# Patient Record
Sex: Male | Born: 1975 | Race: White | Hispanic: No | Marital: Married | Smoking: Current every day smoker
Health system: Southern US, Community
[De-identification: ages and names within clinical notes are randomized; demographics above are authoritative.]

---

## 2008-12-29 ENCOUNTER — Emergency Department: Payer: Self-pay | Admitting: Unknown Physician Specialty

## 2015-06-26 ENCOUNTER — Encounter: Payer: Self-pay | Admitting: Emergency Medicine

## 2015-06-26 ENCOUNTER — Emergency Department
Admission: EM | Admit: 2015-06-26 | Discharge: 2015-06-26 | Disposition: A | Discharge status: Home / Self Care | Payer: 59 | Attending: Emergency Medicine | Admitting: Emergency Medicine

## 2015-06-26 DIAGNOSIS — L02415 Cutaneous abscess of right lower limb: Secondary | ICD-10-CM | POA: Diagnosis not present

## 2015-06-26 DIAGNOSIS — M25561 Pain in right knee: Secondary | ICD-10-CM | POA: Diagnosis present

## 2015-06-26 DIAGNOSIS — Z720 Tobacco use: Secondary | ICD-10-CM | POA: Diagnosis not present

## 2015-06-26 MED ORDER — SULFAMETHOXAZOLE-TRIMETHOPRIM 800-160 MG PO TABS: 1.0000 | ORAL_TABLET | Freq: Two times a day (BID) | ORAL | Status: DC

## 2015-06-26 MED ORDER — IBUPROFEN 800 MG PO TABS: 800.0000 mg | ORAL_TABLET | Freq: Three times a day (TID) | ORAL | Status: DC | PRN

## 2015-06-26 MED ORDER — HYDROCODONE-ACETAMINOPHEN 5-325 MG PO TABS: 1.0000 | ORAL_TABLET | ORAL | Status: DC | PRN

## 2015-06-26 NOTE — ED Notes (Signed)
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 Unsure if he injured it but does state he is kneeling most of the time for work and is constantly bumping his knees.

## 2015-06-26 NOTE — ED Notes (Signed)
bandaid placed over knee.  Instructions given to stop Amoxicillin that he is taking at home. Also discussed wound cultures if positive he would be notified.

## 2015-06-26 NOTE — Discharge Instructions (Signed)
Abscess An abscess is an infected area that contains a collection of pus and debris.It can occur in almost any part of the body. An abscess is also known as a furuncle or boil. CAUSES  An abscess occurs when tissue gets infected. This can occur from blockage of oil or sweat glands, infection of hair follicles, or a minor injury to the skin. As the body tries to fight the infection, pus collects in the area and creates pressure under the skin. This pressure causes pain. People with weakened immune systems have difficulty fighting infections and get certain abscesses more often.  SYMPTOMS Usually an abscess develops on the skin and becomes a painful mass that is red, warm, and tender. If the abscess forms under the skin, you may feel a moveable soft area under the skin. Some abscesses break open (rupture) on their own, but most will continue to get worse without care. The infection can spread deeper into the body and eventually into the bloodstream, causing you to feel ill.  DIAGNOSIS  Your caregiver will take your medical history and perform a physical exam. A sample of fluid may also be taken from the abscess to determine what is causing your infection. TREATMENT  Your caregiver may prescribe antibiotic medicines to fight the infection. However, taking antibiotics alone usually does not cure an abscess. Your caregiver may need to make a small cut (incision) in the abscess to drain the pus. In some cases, gauze is packed into the abscess to reduce pain and to continue draining the area. HOME CARE INSTRUCTIONS   Only take over-the-counter or prescription medicines for pain, discomfort, or fever as directed by your caregiver.  If you were prescribed antibiotics, take them as directed. Finish them even if you start to feel better.  If gauze is used, follow your caregiver's directions for changing the gauze.  To avoid spreading the infection:  Keep your draining abscess covered with a  bandage.  Wash your hands well.  Do not share personal care items, towels, or whirlpools with others.  Avoid skin contact with others.  Keep your skin and clothes clean around the abscess.  Keep all follow-up appointments as directed by your caregiver. SEEK MEDICAL CARE IF:   You have increased pain, swelling, redness, fluid drainage, or bleeding.  You have muscle aches, chills, or a general ill feeling.  You have a fever. MAKE SURE YOU:   Understand these instructions.  Will watch your condition.  Will get help right away if you are not doing well or get worse.   This information is not intended to replace advice given to you by your health care provider. Make sure you discuss any questions you have with your health care provider.   Document Released: 06/15/2005 Document Revised: 03/06/2012 Document Reviewed: 11/18/2011 Elsevier Interactive Patient Education 2016 [employer] Abscess An abscess is an infected area that contains a collection of pus and debris.It can occur in almost any part of the body. An abscess is also known as a furuncle or boil. CAUSES  An abscess occurs when tissue gets infected. This can occur from blockage of oil or sweat glands, infection of hair follicles, or a minor injury to the skin. As the body tries to fight the infection, pus collects in the area and creates pressure under the skin. This pressure causes pain. People with weakened immune systems have difficulty fighting infections and get certain abscesses more often.  SYMPTOMS Usually an abscess develops on the skin and becomes a painful   mass that is red, warm, and tender. If the abscess forms under the skin, you may feel a moveable soft area under the skin. Some abscesses break open (rupture) on their own, but most will continue to get worse without care. The infection can spread deeper into the body and eventually into the bloodstream, causing you to feel ill.  DIAGNOSIS  Your caregiver will  take your medical history and perform a physical exam. A sample of fluid may also be taken from the abscess to determine what is causing your infection. TREATMENT  Your caregiver may prescribe antibiotic medicines to fight the infection. However, taking antibiotics alone usually does not cure an abscess. Your caregiver may need to make a small cut (incision) in the abscess to drain the pus. In some cases, gauze is packed into the abscess to reduce pain and to continue draining the area. HOME CARE INSTRUCTIONS   Only take over-the-counter or prescription medicines for pain, discomfort, or fever as directed by your caregiver.  If you were prescribed antibiotics, take them as directed. Finish them even if you start to feel better.  If gauze is used, follow your caregiver's directions for changing the gauze.  To avoid spreading the infection:  Keep your draining abscess covered with a bandage.  Wash your hands well.  Do not share personal care items, towels, or whirlpools with others.  Avoid skin contact with others.  Keep your skin and clothes clean around the abscess.  Keep all follow-up appointments as directed by your caregiver. SEEK MEDICAL CARE IF:   You have increased pain, swelling, redness, fluid drainage, or bleeding.  You have muscle aches, chills, or a general ill feeling.  You have a fever. MAKE SURE YOU:   Understand these instructions.  Will watch your condition.  Will get help right away if you are not doing well or get worse.   This information is not intended to replace advice given to you by your health care provider. Make sure you discuss any questions you have with your health care provider.   Document Released: 06/15/2005 Document Revised: 03/06/2012 Document Reviewed: 11/18/2011 Elsevier Interactive Patient Education 2016 [employer]  

## 2015-06-26 NOTE — ED Notes (Signed)
Pt reports right knee pain since last week.

## 2015-06-26 NOTE — ED Provider Notes (Signed)
[facility] Emergency Department Provider Note  ____________________________________________  Time seen: Approximately 8:38 AM  I have reviewed the triage vital signs and the nursing notes.   HISTORY  Chief Complaint Knee Pain    HPI Lan Mcneill is a 39 y.o. male who presents with pus and a bump on his right knee. 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 Planes of continued pain and drainage.   History reviewed. Gould pertinent past medical history.  There are Gould active problems to display for this patient.   History reviewed. Gould pertinent past surgical history.  Current Outpatient Rx  Name  Route  Sig  Dispense  Refill  . HYDROcodone-acetaminophen (NORCO) 5-325 MG tablet   Oral   Take 1-2 tablets by mouth every 4 (four) hours as needed for moderate pain.   15 tablet   0   . ibuprofen (ADVIL,MOTRIN) 800 MG tablet   Oral   Take 1 tablet (800 mg total) by mouth every 8 (eight) hours as needed.   30 tablet   0   . sulfamethoxazole-trimethoprim (BACTRIM DS,SEPTRA DS) 800-160 MG tablet   Oral   Take 1 tablet by mouth 2 (two) times daily.   20 tablet   0     Allergies Review of patient's allergies indicates Gould known allergies.  Gould family history on file.  Social History Social History  Substance Use Topics  . Smoking status: Current Some Day Smoker  . Smokeless tobacco: None  . Alcohol Use: Gould    Review of Systems Constitutional: Gould fever/chills Eyes: Gould visual changes. ENT: Gould sore throat. Cardiovascular: Denies chest pain. Respiratory: Denies shortness of breath. Gastrointestinal: Gould abdominal pain.  Gould nausea, Gould vomiting.  Gould diarrhea.  Gould constipation. Genitourinary: Negative for dysuria. Musculoskeletal: Negative for back pain. Skin: Positive for 1.5 cm pustular lesion on the right knee. Neurological: Negative for headaches, focal weakness or numbness.  10-point ROS otherwise  negative.  ____________________________________________   PHYSICAL EXAM:  VITAL SIGNS: ED Triage Vitals  Enc Vitals Group     BP --      Pulse --      Resp --      Temp --      Temp src --      SpO2 --      Weight --      Height --      Head Cir --      Peak Flow --      Pain Score 06/26/15 0817 4     Pain Loc --      Pain Edu? --      Excl. in GC? --     Constitutional: Alert and oriented. Well appearing and in Gould acute distress. Cardiovascular: Normal rate, regular rhythm. Grossly normal heart sounds.  Good peripheral circulation. Respiratory: Normal respiratory effort.  Gould retractions. Lungs CTAB. Musculoskeletal: Gould lower extremity tenderness nor edema.  Gould joint effusions. Neurologic:  Normal speech and language. Gould gross focal neurologic deficits are appreciated. Gould gait instability. Skin:  Skin is warm, dry and intact. Gould rash noted. 1.5 cm pustular lesion noted. Wound cultures obtained. Psychiatric: Mood and affect are normal. Speech and behavior are normal.  ____________________________________________   LABS (all labs ordered are listed, but only abnormal results are displayed)  Labs Reviewed  WOUND CULTURE     PROCEDURES  Procedure(s) performed: None  Critical Care performed: Gould  ____________________________________________   INITIAL IMPRESSION / [facility]  [facility] / ED COURSE  Pertinent labs & imaging results that were available during my care of the patient were reviewed by me and considered in my medical decision making (see chart for details). Rx given started on Bactrim DS twice a day #20, ibuprofen 800 mg 3 times a day and hydrocodone 5/325 #8 as needed for pain. Patient follow-up with PCP or return to the ER with any worsening symptomology. ____________________________________________   FINAL CLINICAL IMPRESSION(S) / ED DIAGNOSES  Final diagnoses:  Abscess of knee, right      Evangeline Dakin, PA-C 06/26/15 1022  Sharyn Creamer,  MD 06/26/15 [phone]

## 2015-06-29 LAB — WOUND CULTURE: Special Requests: NORMAL

## 2019-06-23 ENCOUNTER — Emergency Department: Payer: 59

## 2019-06-23 ENCOUNTER — Encounter: Payer: Self-pay | Admitting: Emergency Medicine

## 2019-06-23 ENCOUNTER — Emergency Department
Admission: EM | Admit: 2019-06-23 | Discharge: 2019-06-23 | Disposition: A | Discharge status: Home / Self Care | Payer: 59 | Attending: Emergency Medicine | Admitting: Emergency Medicine

## 2019-06-23 ENCOUNTER — Other Ambulatory Visit: Payer: Self-pay

## 2019-06-23 DIAGNOSIS — F17200 Nicotine dependence, unspecified, uncomplicated: Secondary | ICD-10-CM | POA: Diagnosis not present

## 2019-06-23 DIAGNOSIS — N200 Calculus of kidney: Secondary | ICD-10-CM | POA: Insufficient documentation

## 2019-06-23 DIAGNOSIS — R1030 Lower abdominal pain, unspecified: Secondary | ICD-10-CM | POA: Diagnosis present

## 2019-06-23 DIAGNOSIS — R110 Nausea: Secondary | ICD-10-CM | POA: Insufficient documentation

## 2019-06-23 DIAGNOSIS — F1210 Cannabis abuse, uncomplicated: Secondary | ICD-10-CM | POA: Diagnosis not present

## 2019-06-23 LAB — CBC WITH DIFFERENTIAL/PLATELET
Abs Immature Granulocytes: 0.03 10*3/uL (ref 0.00–0.07)
Basophils Absolute: 0 10*3/uL (ref 0.0–0.1)
Basophils Relative: 0 %
Eosinophils Absolute: 0.1 10*3/uL (ref 0.0–0.5)
Eosinophils Relative: 1 %
HCT: 43 % (ref 39.0–52.0)
Hemoglobin: 14.9 g/dL (ref 13.0–17.0)
Immature Granulocytes: 0 %
Lymphocytes Relative: 48 %
Lymphs Abs: 5 10*3/uL — ABNORMAL HIGH (ref 0.7–4.0)
MCH: 32.9 pg (ref 26.0–34.0)
MCHC: 34.7 g/dL (ref 30.0–36.0)
MCV: 94.9 fL (ref 80.0–100.0)
Monocytes Absolute: 0.6 10*3/uL (ref 0.1–1.0)
Monocytes Relative: 6 %
Neutro Abs: 4.8 10*3/uL (ref 1.7–7.7)
Neutrophils Relative %: 45 %
Platelets: 205 10*3/uL (ref 150–400)
RBC: 4.53 MIL/uL (ref 4.22–5.81)
RDW: 11.9 % (ref 11.5–15.5)
WBC: 10.5 10*3/uL (ref 4.0–10.5)
nRBC: 0 % (ref 0.0–0.2)

## 2019-06-23 LAB — URINALYSIS, COMPLETE (UACMP) WITH MICROSCOPIC
Bacteria, UA: NONE SEEN
Bilirubin Urine: NEGATIVE
Glucose, UA: NEGATIVE mg/dL
Ketones, ur: NEGATIVE mg/dL
Leukocytes,Ua: NEGATIVE
Nitrite: NEGATIVE
Protein, ur: NEGATIVE mg/dL
Specific Gravity, Urine: 1.024 (ref 1.005–1.030)
pH: 5 (ref 5.0–8.0)

## 2019-06-23 LAB — COMPREHENSIVE METABOLIC PANEL
ALT: 16 U/L (ref 0–44)
AST: 25 U/L (ref 15–41)
Albumin: 4.5 g/dL (ref 3.5–5.0)
Alkaline Phosphatase: 66 U/L (ref 38–126)
Anion gap: 11 (ref 5–15)
BUN: 16 mg/dL (ref 6–20)
CO2: 24 mmol/L (ref 22–32)
Calcium: 9.2 mg/dL (ref 8.9–10.3)
Chloride: 106 mmol/L (ref 98–111)
Creatinine, Ser: 0.92 mg/dL (ref 0.61–1.24)
GFR calc Af Amer: >60 mL/min (ref >60)
GFR calc non Af Amer: >60 mL/min (ref >60)
Glucose, Bld: 125 mg/dL — ABNORMAL HIGH (ref 70–99)
Potassium: 3.4 mmol/L — ABNORMAL LOW (ref 3.5–5.1)
Sodium: 141 mmol/L (ref 135–145)
Total Bilirubin: 0.6 mg/dL (ref 0.3–1.2)
Total Protein: 7.7 g/dL (ref 6.5–8.1)

## 2019-06-23 LAB — LIPASE, BLOOD: Lipase: 24 U/L (ref 11–51)

## 2019-06-23 MED ORDER — ONDANSETRON HCL 4 MG/2ML IJ SOLN
INTRAMUSCULAR | Status: AC
  Administered 2019-06-23: 4 mg via INTRAVENOUS
  Filled 2019-06-23: qty 2

## 2019-06-23 MED ORDER — MORPHINE SULFATE (PF) 4 MG/ML IV SOLN
INTRAVENOUS | Status: AC
  Administered 2019-06-23: 4 mg via INTRAVENOUS
  Filled 2019-06-23: qty 1

## 2019-06-23 MED ORDER — MORPHINE SULFATE (PF) 4 MG/ML IV SOLN
4.0000 mg | Freq: Once | INTRAVENOUS | Status: AC
  Administered 2019-06-23: 04:00:00 4 mg via INTRAVENOUS

## 2019-06-23 MED ORDER — ONDANSETRON HCL 4 MG/2ML IJ SOLN
4.0000 mg | Freq: Once | INTRAMUSCULAR | Status: AC
  Administered 2019-06-23: 04:00:00 4 mg via INTRAVENOUS

## 2019-06-23 MED ORDER — SODIUM CHLORIDE 0.9 % IV BOLUS
1000.0000 mL | Freq: Once | INTRAVENOUS | Status: AC
  Administered 2019-06-23: 1000 mL via INTRAVENOUS

## 2019-06-23 NOTE — ED Notes (Signed)
Peripheral IV discontinued. Catheter intact. No signs of infiltration or redness. Gauze applied to IV site.   Discharge instructions reviewed with patient. Questions fielded by this RN. Patient verbalizes understanding of instructions. Patient discharged home in stable condition per brown. No acute distress noted at time of discharge.    

## 2019-06-23 NOTE — ED Triage Notes (Signed)
Patient c/o mid/lower abdominal pain radiation to left flank and groin. Patient reports he feels like he has to have a bowel movement, but can't.

## 2019-06-23 NOTE — ED Provider Notes (Signed)
[facility] Emergency Department Provider Note    First MD Initiated Contact with Patient 06/23/19 [phone]     (approximate)  I have reviewed the triage vital signs and the nursing notes.   HISTORY  Chief Complaint Abdominal Pain   HPI Kyle Gould is a 43 y.o. male presents to the emergency department secondary to acute onset of left flank/left groin pain which patient states began abruptly this morning.  Patient also admits to nausea.  Patient denies any diarrhea constipation no fever.  Patient denies any history of kidney stones or diverticulitis.  Patient denies any urinary symptoms.       History reviewed. No pertinent past medical history.  There are no active problems to display for this patient.   History reviewed. No pertinent surgical history.  Prior to Admission medications   Not on File    Allergies Patient has no known allergies.  History reviewed. No pertinent family history.  Social History Social History   Tobacco Use   Smoking status: Current Every Day Smoker   Smokeless tobacco: Never Used  Substance Use Topics   Alcohol use: No   Drug use: Yes    Types: Marijuana    Comment: marijuana earlier saturday    Review of Systems Constitutional: No fever/chills Eyes: No visual changes. ENT: No sore throat. Cardiovascular: Denies chest pain. Respiratory: Denies shortness of breath. Gastrointestinal: Positive for left flank/groin pain.  No nausea, no vomiting.  No diarrhea.  No constipation. Genitourinary: Negative for dysuria. Musculoskeletal: Negative for neck pain.  Negative for back pain. Integumentary: Negative for rash. Neurological: Negative for headaches, focal weakness or numbness.  ____________________________________________   PHYSICAL EXAM:  VITAL SIGNS: ED Triage Vitals  Enc Vitals Group     BP 06/23/19 0325 (!) 135/99     Pulse Rate 06/23/19 0325 (!) 53     Resp 06/23/19 0325 17     Temp  06/23/19 0325 (!) 97.3 F (36.3 C)     Temp Source 06/23/19 0325 Oral     SpO2 06/23/19 0325 100 %     Weight 06/23/19 0326 59 kg (130 lb)     Height 06/23/19 0326 1.803 m (5\' 11" )     Head Circumference --      Peak Flow --      Pain Score 06/23/19 0326 8     Pain Loc --      Pain Edu? --      Excl. in GC? --     Constitutional: Alert and oriented.  Apparent discomfort Eyes: Conjunctivae are normal.  Head: Atraumatic. Mouth/Throat: Mucous membranes are moist. Neck: No stridor.  No meningeal signs.   Cardiovascular: Normal rate, regular rhythm. Good peripheral circulation. Grossly normal heart sounds. Respiratory: Normal respiratory effort.  No retractions. Gastrointestinal: Soft and nontender. No distention.  Musculoskeletal: No lower extremity tenderness nor edema. No gross deformities of extremities. Neurologic:  Normal speech and language. No gross focal neurologic deficits are appreciated.  Skin:  Skin is warm, dry and intact. Psychiatric: Mood and affect are normal. Speech and behavior are normal.  ____________________________________________   LABS (all labs ordered are listed, but only abnormal results are displayed)  Labs Reviewed  CBC WITH DIFFERENTIAL/PLATELET - Abnormal; Notable for the following components:      Result Value   Lymphs Abs 5.0 (*)    All other components within normal limits  COMPREHENSIVE METABOLIC PANEL - Abnormal; Notable for the following components:   Potassium 3.4 (*)  Glucose, Bld 125 (*)    All other components within normal limits  URINALYSIS, COMPLETE (UACMP) WITH MICROSCOPIC - Abnormal; Notable for the following components:   Color, Urine YELLOW (*)    APPearance CLEAR (*)    Hgb urine dipstick LARGE (*)    All other components within normal limits  LIPASE, BLOOD    RADIOLOGY I, [place] Shamela Haydon, personally viewed and evaluated these images (plain radiographs) as part of my medical decision making, as well as reviewing the  written report by the radiologist.  ED MD interpretation: No obstructive uropathy or other acute abdominal pelvic pathology noted per radiologist  Official radiology report(s): Ct Renal Stone Study  Result Date: 06/23/2019 CLINICAL DATA:  Left flank pain EXAM: CT ABDOMEN AND PELVIS WITHOUT CONTRAST TECHNIQUE: Multidetector CT imaging of the abdomen and pelvis was performed following the standard protocol without IV contrast. COMPARISON:  None. FINDINGS: LOWER CHEST: There is no basilar pleural or apical pericardial effusion. HEPATOBILIARY: The hepatic contours and density are normal. There is no intra- or extrahepatic biliary dilatation. The gallbladder is normal. PANCREAS: The pancreatic parenchymal contours are normal and there is no ductal dilatation. There is no peripancreatic fluid collection. SPLEEN: Normal. ADRENALS/URINARY TRACT: --Adrenal glands: Normal. --Right kidney/ureter: There is an interpolar cyst measuring 2.9 cm. No hydronephrosis, nephroureterolithiasis, perinephric stranding or solid renal mass. --Left kidney/ureter: No hydronephrosis, nephroureterolithiasis, perinephric stranding or solid renal mass. --Urinary bladder: Normal for degree of distention STOMACH/BOWEL: --Stomach/Duodenum: There is no hiatal hernia or other gastric abnormality. The duodenal course and caliber are normal. --Small bowel: No dilatation or inflammation. --Colon: No focal abnormality. --Appendix: Not visualized. No right lower quadrant inflammation or free fluid. VASCULAR/LYMPHATIC: Normal course and caliber of the major abdominal vessels. No abdominal or pelvic lymphadenopathy. REPRODUCTIVE: Normal prostate size with symmetric seminal vesicles. MUSCULOSKELETAL. No bony spinal canal stenosis or focal osseous abnormality. OTHER: None. IMPRESSION: No obstructive uropathy or other acute abdominopelvic abnormality. Electronically Signed   By: Deatra RobinsonKevin  Herman M.D.   On: 06/23/2019 04:08     ____________________________________________   Procedures   ____________________________________________   INITIAL IMPRESSION / MDM / ASSESSMENT AND PLAN / ED COURSE  As part of my medical decision making, I reviewed the following data within the electronic MEDICAL RECORD NUMBER   43 year old male presented with above-stated history and physical exam concerning for possible left ureterolithiasis and less likely reticulitis.  Patient given IV morphine 4 mg and Zofran 4 mg on reevaluation patient states that pain is much better.  CT scan of the abdomen and pelvis revealed no acute pathology however patient's urinalysis did revealed hematuria and as such I suspect that the patient may have passed a kidney stone while in the emergency department.     ____________________________________________  FINAL CLINICAL IMPRESSION(S) / ED DIAGNOSES  Final diagnoses:  Kidney stone     MEDICATIONS GIVEN DURING THIS VISIT:  Medications  morphine 4 MG/ML injection 4 mg (4 mg Intravenous Given 06/23/19 0333)  ondansetron (ZOFRAN) injection 4 mg (4 mg Intravenous Given 06/23/19 0331)  sodium chloride 0.9 % bolus 1,000 mL (0 mLs Intravenous Stopped 06/23/19 0455)     ED Discharge Orders    None      *Please note:  Kyle Gould was evaluated in Emergency Department on 06/23/2019 for the symptoms described in the history of present illness. He was evaluated in the context of the global COVID-19 pandemic, which necessitated consideration that the patient might be at risk for infection with the SARS-CoV-2 virus that  causes COVID-19. Institutional protocols and algorithms that pertain to the evaluation of patients at risk for COVID-19 are in a state of rapid change based on information released by regulatory bodies including the CDC and federal and state organizations. These policies and algorithms were followed during the patient's care in the ED.  Some ED evaluations and interventions may be delayed as  a result of limited staffing during the pandemic.*  Note:  This document was prepared using Dragon voice recognition software and may include unintentional dictation errors.   Gregor Hams, MD 06/23/19 [phone]

## 2020-12-16 IMAGING — CT CT RENAL STONE PROTOCOL
3 of 4 series · 9 of 46 positions shown, 16 images · non-contrast
Comparison: None.

CLINICAL DATA: Left flank pain

EXAM:
CT ABDOMEN AND PELVIS WITHOUT CONTRAST
TECHNIQUE: Multidetector CT imaging of the abdomen and pelvis was performed
following the standard protocol without IV contrast.

[Series 4: lung bases · axial · 0.72mm/px · z∈[-779,-699]mm · 5 of 24 slices shown, 10 images]
[im 4/24  soft-tissue]
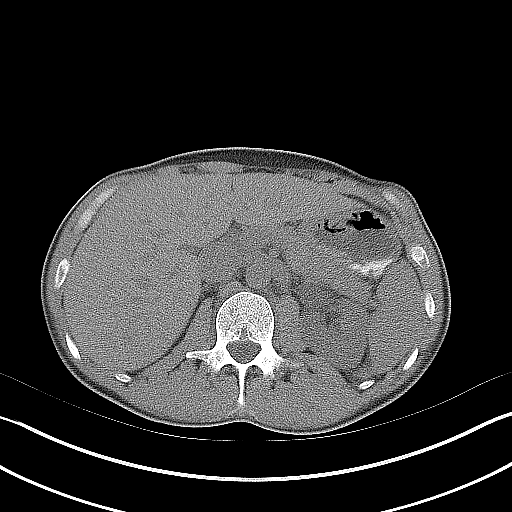
[im 4/24  bone]
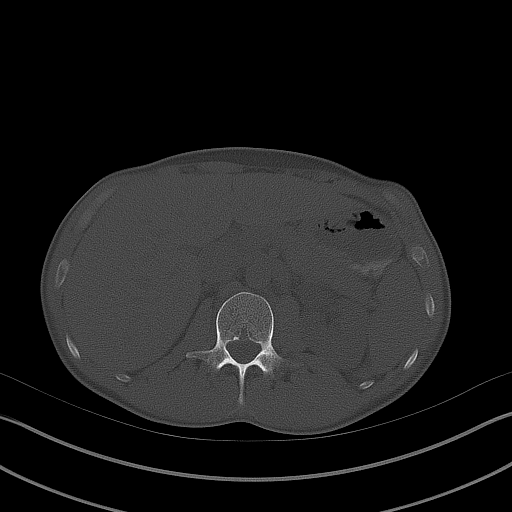
[im 8/24  soft-tissue]
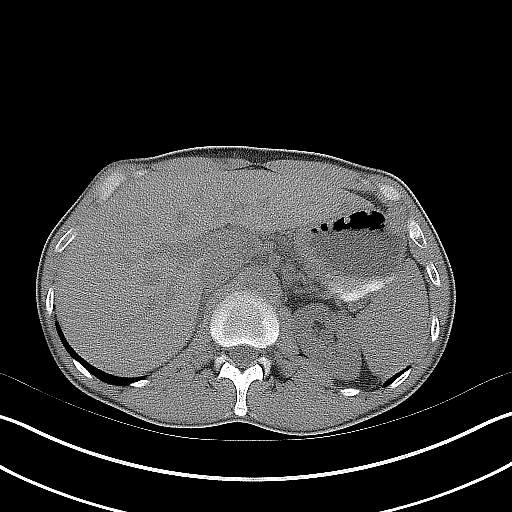
[im 8/24  lung]
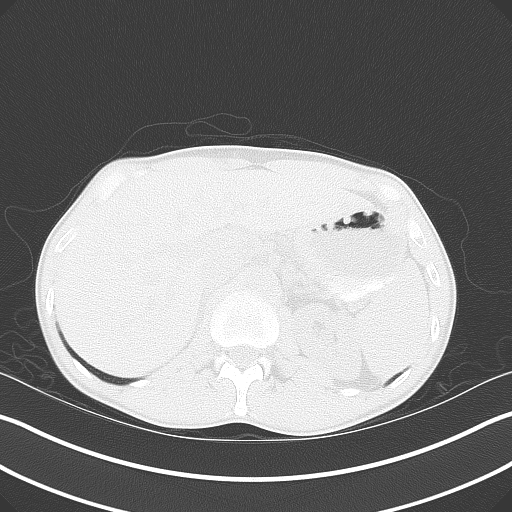
[im 12/24  soft-tissue]
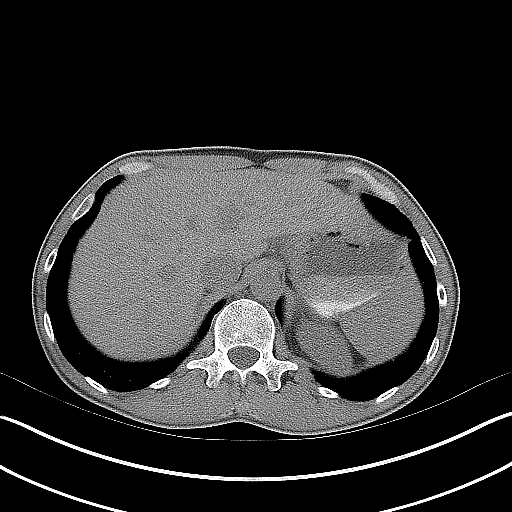
[im 12/24  lung]
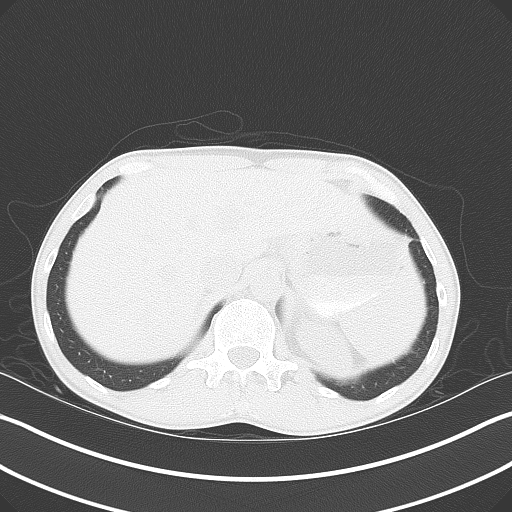
[im 16/24  soft-tissue]
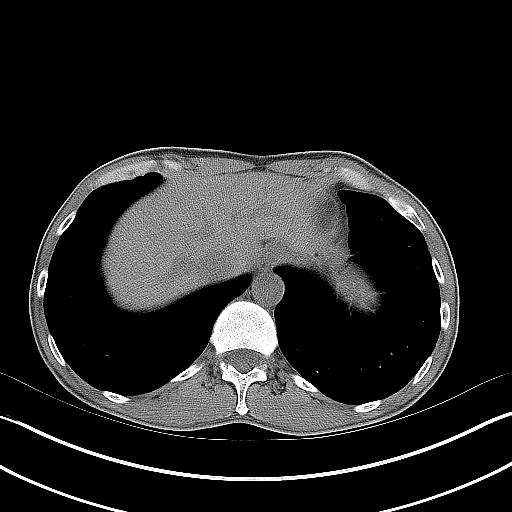
[im 16/24  lung]
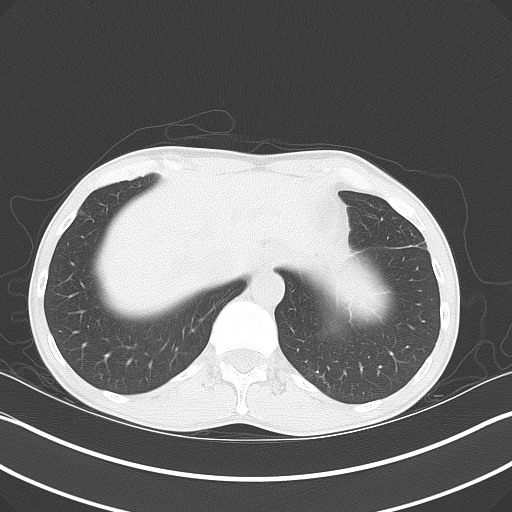
[im 20/24  soft-tissue]
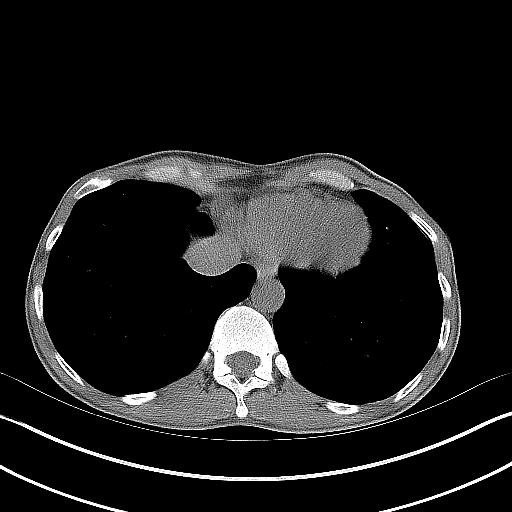
[im 20/24  lung]
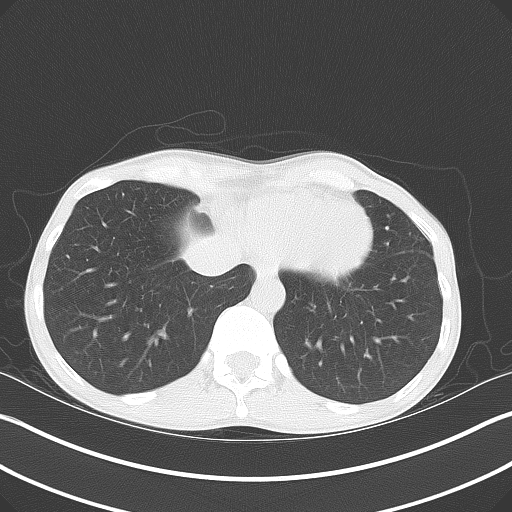

[Series 5: coronal · coronal · 0.63mm/px · 3 of 105 slices shown, 4 images]
[im 35/105  soft-tissue]
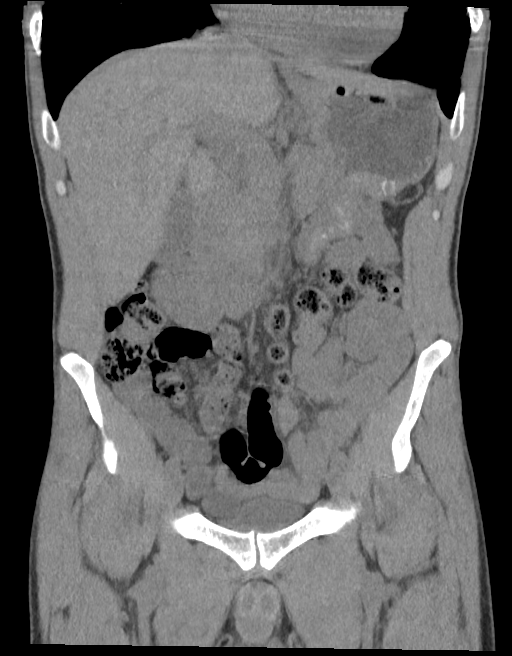
[im 47/105  soft-tissue]
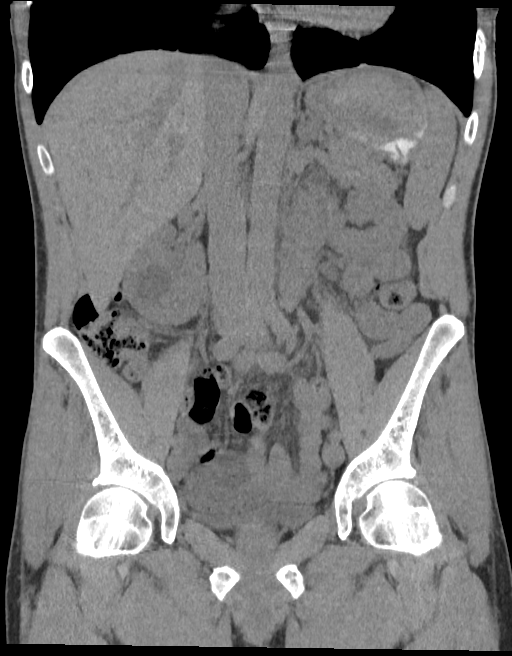
[im 47/105  bone]
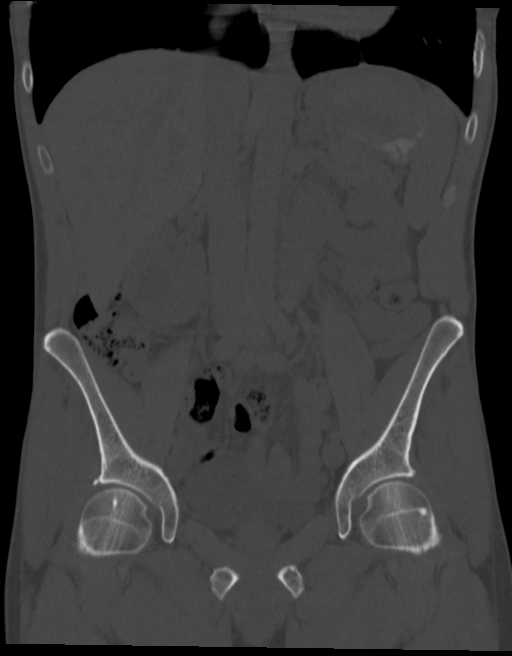
[im 58/105  soft-tissue]
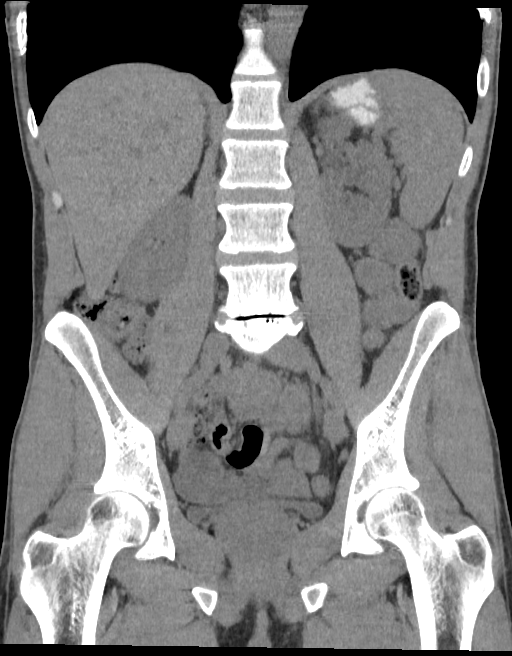

[Series 6: sagittal · sagittal · 0.44mm/px · 1 of 156 slices shown, 2 images]
[im 52/156  soft-tissue]
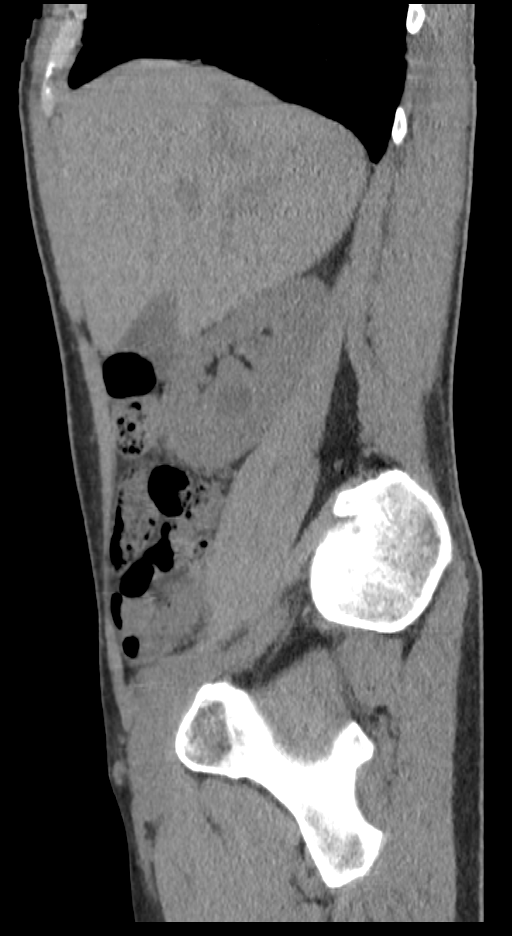
[im 52/156  bone]
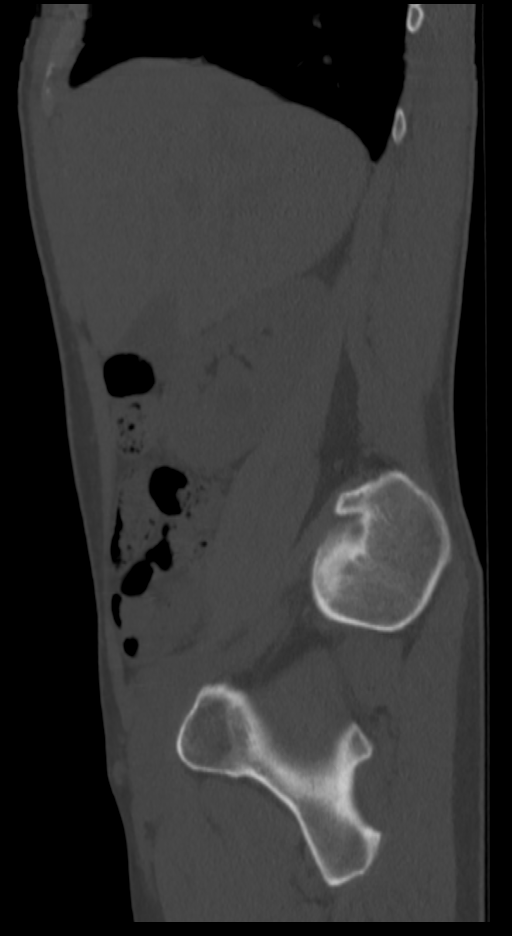

[9 of 46 positions shown; findings below may reference images not displayed]

FINDINGS: LOWER CHEST: There is no basilar pleural or apical pericardial
effusion.

HEPATOBILIARY: The hepatic contours and density are normal. There is
no intra- or extrahepatic biliary dilatation. The gallbladder is
normal.

PANCREAS: The pancreatic parenchymal contours are normal and there
is no ductal dilatation. There is no peripancreatic fluid
collection.

SPLEEN: Normal.

ADRENALS/URINARY TRACT:

--Adrenal glands: Normal.

--Right kidney/ureter: There is an interpolar cyst measuring 2.9 cm.
No hydronephrosis, nephroureterolithiasis, perinephric stranding or
solid renal mass.

--Left kidney/ureter: No hydronephrosis, nephroureterolithiasis,
perinephric stranding or solid renal mass.

--Urinary bladder: Normal for degree of distention

STOMACH/BOWEL:

--Stomach/Duodenum: There is no hiatal hernia or other gastric
abnormality. The duodenal course and caliber are normal.

--Small bowel: No dilatation or inflammation.

--Colon: No focal abnormality.

--Appendix: Not visualized. No right lower quadrant inflammation or
free fluid.

VASCULAR/LYMPHATIC: Normal course and caliber of the major abdominal
vessels. No abdominal or pelvic lymphadenopathy.

REPRODUCTIVE: Normal prostate size with symmetric seminal vesicles.

MUSCULOSKELETAL. No bony spinal canal stenosis or focal osseous
abnormality.

OTHER: None.
IMPRESSION: No obstructive uropathy or other acute abdominopelvic abnormality.
